# Patient Record
Sex: Male | Born: 2000 | Race: Black or African American | Hispanic: No | Marital: Single | Smoking: Never smoker
Health system: Southern US, Community
[De-identification: ages and names within clinical notes are randomized; demographics above are authoritative.]

---
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
## 2018-05-08 DIAGNOSIS — L700 Acne vulgaris: Secondary | ICD-10-CM | POA: Diagnosis not present

## 2018-05-08 DIAGNOSIS — L819 Disorder of pigmentation, unspecified: Secondary | ICD-10-CM | POA: Diagnosis not present

## 2018-07-17 DIAGNOSIS — S9031XA Contusion of right foot, initial encounter: Secondary | ICD-10-CM | POA: Diagnosis not present

## 2018-07-17 DIAGNOSIS — M79671 Pain in right foot: Secondary | ICD-10-CM | POA: Diagnosis not present

## 2018-08-12 DIAGNOSIS — M7661 Achilles tendinitis, right leg: Secondary | ICD-10-CM | POA: Diagnosis not present

## 2018-08-12 DIAGNOSIS — M79671 Pain in right foot: Secondary | ICD-10-CM | POA: Diagnosis not present

## 2018-08-12 DIAGNOSIS — M7751 Other enthesopathy of right foot: Secondary | ICD-10-CM | POA: Diagnosis not present

## 2018-10-31 DIAGNOSIS — M7751 Other enthesopathy of right foot: Secondary | ICD-10-CM | POA: Diagnosis not present

## 2018-10-31 DIAGNOSIS — M79671 Pain in right foot: Secondary | ICD-10-CM | POA: Diagnosis not present

## 2019-01-21 DIAGNOSIS — R200 Anesthesia of skin: Secondary | ICD-10-CM | POA: Diagnosis not present

## 2019-01-21 DIAGNOSIS — R202 Paresthesia of skin: Secondary | ICD-10-CM | POA: Diagnosis not present

## 2019-01-21 DIAGNOSIS — M25512 Pain in left shoulder: Secondary | ICD-10-CM | POA: Diagnosis not present

## 2019-01-22 DIAGNOSIS — Z130 Encounter for screening for diseases of the blood and blood-forming organs and certain disorders involving the immune mechanism: Secondary | ICD-10-CM | POA: Diagnosis not present

## 2019-01-30 DIAGNOSIS — M25512 Pain in left shoulder: Secondary | ICD-10-CM | POA: Diagnosis not present

## 2019-01-30 DIAGNOSIS — M7542 Impingement syndrome of left shoulder: Secondary | ICD-10-CM | POA: Diagnosis not present

## 2019-02-05 DIAGNOSIS — J019 Acute sinusitis, unspecified: Secondary | ICD-10-CM | POA: Diagnosis not present

## 2019-02-05 DIAGNOSIS — Z209 Contact with and (suspected) exposure to unspecified communicable disease: Secondary | ICD-10-CM | POA: Diagnosis not present

## 2019-02-05 DIAGNOSIS — B9689 Other specified bacterial agents as the cause of diseases classified elsewhere: Secondary | ICD-10-CM | POA: Diagnosis not present

## 2019-02-05 DIAGNOSIS — Z03818 Encounter for observation for suspected exposure to other biological agents ruled out: Secondary | ICD-10-CM | POA: Diagnosis not present

## 2019-02-19 DIAGNOSIS — D649 Anemia, unspecified: Secondary | ICD-10-CM | POA: Diagnosis not present

## 2019-04-02 DIAGNOSIS — Z1159 Encounter for screening for other viral diseases: Secondary | ICD-10-CM | POA: Diagnosis not present

## 2019-04-07 DIAGNOSIS — M50222 Other cervical disc displacement at C5-C6 level: Secondary | ICD-10-CM | POA: Diagnosis not present

## 2019-04-07 DIAGNOSIS — M5144 Schmorl's nodes, thoracic region: Secondary | ICD-10-CM | POA: Diagnosis not present

## 2019-04-21 DIAGNOSIS — Z1159 Encounter for screening for other viral diseases: Secondary | ICD-10-CM | POA: Diagnosis not present

## 2019-05-05 DIAGNOSIS — Z03818 Encounter for observation for suspected exposure to other biological agents ruled out: Secondary | ICD-10-CM | POA: Diagnosis not present

## 2019-05-09 DIAGNOSIS — Z1159 Encounter for screening for other viral diseases: Secondary | ICD-10-CM | POA: Diagnosis not present

## 2019-05-12 DIAGNOSIS — Z1159 Encounter for screening for other viral diseases: Secondary | ICD-10-CM | POA: Diagnosis not present

## 2019-05-20 DIAGNOSIS — Z1159 Encounter for screening for other viral diseases: Secondary | ICD-10-CM | POA: Diagnosis not present

## 2019-06-25 DIAGNOSIS — Z1159 Encounter for screening for other viral diseases: Secondary | ICD-10-CM | POA: Diagnosis not present

## 2019-07-01 DIAGNOSIS — Z1159 Encounter for screening for other viral diseases: Secondary | ICD-10-CM | POA: Diagnosis not present

## 2019-07-07 DIAGNOSIS — Z1159 Encounter for screening for other viral diseases: Secondary | ICD-10-CM | POA: Diagnosis not present

## 2019-07-09 DIAGNOSIS — Z1159 Encounter for screening for other viral diseases: Secondary | ICD-10-CM | POA: Diagnosis not present

## 2019-07-10 DIAGNOSIS — I499 Cardiac arrhythmia, unspecified: Secondary | ICD-10-CM | POA: Diagnosis not present

## 2019-07-10 DIAGNOSIS — M5412 Radiculopathy, cervical region: Secondary | ICD-10-CM | POA: Diagnosis not present

## 2019-07-10 DIAGNOSIS — F064 Anxiety disorder due to known physiological condition: Secondary | ICD-10-CM | POA: Diagnosis not present

## 2019-07-10 DIAGNOSIS — S060X0A Concussion without loss of consciousness, initial encounter: Secondary | ICD-10-CM | POA: Diagnosis not present

## 2019-07-14 DIAGNOSIS — S060X0A Concussion without loss of consciousness, initial encounter: Secondary | ICD-10-CM | POA: Diagnosis not present

## 2019-07-15 DIAGNOSIS — Z1159 Encounter for screening for other viral diseases: Secondary | ICD-10-CM | POA: Diagnosis not present

## 2019-07-15 DIAGNOSIS — S060X0A Concussion without loss of consciousness, initial encounter: Secondary | ICD-10-CM | POA: Diagnosis not present

## 2019-07-17 DIAGNOSIS — S060X0A Concussion without loss of consciousness, initial encounter: Secondary | ICD-10-CM | POA: Diagnosis not present

## 2019-07-22 DIAGNOSIS — S060X0A Concussion without loss of consciousness, initial encounter: Secondary | ICD-10-CM | POA: Diagnosis not present

## 2019-07-22 DIAGNOSIS — Z1159 Encounter for screening for other viral diseases: Secondary | ICD-10-CM | POA: Diagnosis not present

## 2019-07-25 DIAGNOSIS — S060X0A Concussion without loss of consciousness, initial encounter: Secondary | ICD-10-CM | POA: Diagnosis not present

## 2019-07-28 DIAGNOSIS — S060X0A Concussion without loss of consciousness, initial encounter: Secondary | ICD-10-CM | POA: Diagnosis not present

## 2019-07-29 DIAGNOSIS — R9431 Abnormal electrocardiogram [ECG] [EKG]: Secondary | ICD-10-CM | POA: Diagnosis not present

## 2019-07-29 DIAGNOSIS — I499 Cardiac arrhythmia, unspecified: Secondary | ICD-10-CM | POA: Diagnosis not present

## 2019-07-29 DIAGNOSIS — Z1159 Encounter for screening for other viral diseases: Secondary | ICD-10-CM | POA: Diagnosis not present

## 2019-07-30 DIAGNOSIS — S060X0A Concussion without loss of consciousness, initial encounter: Secondary | ICD-10-CM | POA: Diagnosis not present

## 2019-08-04 DIAGNOSIS — Z1159 Encounter for screening for other viral diseases: Secondary | ICD-10-CM | POA: Diagnosis not present

## 2019-08-06 DIAGNOSIS — S060X0A Concussion without loss of consciousness, initial encounter: Secondary | ICD-10-CM | POA: Diagnosis not present

## 2019-08-07 DIAGNOSIS — R9431 Abnormal electrocardiogram [ECG] [EKG]: Secondary | ICD-10-CM | POA: Diagnosis not present

## 2019-08-07 DIAGNOSIS — S060X0A Concussion without loss of consciousness, initial encounter: Secondary | ICD-10-CM | POA: Diagnosis not present

## 2019-08-07 DIAGNOSIS — I071 Rheumatic tricuspid insufficiency: Secondary | ICD-10-CM | POA: Diagnosis not present

## 2019-08-07 DIAGNOSIS — I499 Cardiac arrhythmia, unspecified: Secondary | ICD-10-CM | POA: Diagnosis not present

## 2019-08-12 DIAGNOSIS — S060X0A Concussion without loss of consciousness, initial encounter: Secondary | ICD-10-CM | POA: Diagnosis not present

## 2019-08-14 DIAGNOSIS — S060X0A Concussion without loss of consciousness, initial encounter: Secondary | ICD-10-CM | POA: Diagnosis not present

## 2019-08-18 DIAGNOSIS — S060X0A Concussion without loss of consciousness, initial encounter: Secondary | ICD-10-CM | POA: Diagnosis not present

## 2019-08-19 DIAGNOSIS — I491 Atrial premature depolarization: Secondary | ICD-10-CM | POA: Diagnosis not present

## 2019-08-19 DIAGNOSIS — I471 Supraventricular tachycardia: Secondary | ICD-10-CM | POA: Diagnosis not present

## 2019-08-19 DIAGNOSIS — I493 Ventricular premature depolarization: Secondary | ICD-10-CM | POA: Diagnosis not present

## 2019-08-21 DIAGNOSIS — S060X0A Concussion without loss of consciousness, initial encounter: Secondary | ICD-10-CM | POA: Diagnosis not present

## 2019-09-09 DIAGNOSIS — Z1152 Encounter for screening for COVID-19: Secondary | ICD-10-CM | POA: Diagnosis not present

## 2019-09-12 DIAGNOSIS — R109 Unspecified abdominal pain: Secondary | ICD-10-CM | POA: Diagnosis not present

## 2019-09-12 DIAGNOSIS — K759 Inflammatory liver disease, unspecified: Secondary | ICD-10-CM | POA: Diagnosis not present

## 2019-09-12 DIAGNOSIS — R1013 Epigastric pain: Secondary | ICD-10-CM | POA: Diagnosis not present

## 2019-09-12 DIAGNOSIS — N289 Disorder of kidney and ureter, unspecified: Secondary | ICD-10-CM | POA: Diagnosis not present

## 2019-09-12 DIAGNOSIS — B179 Acute viral hepatitis, unspecified: Secondary | ICD-10-CM | POA: Diagnosis not present

## 2019-09-13 DIAGNOSIS — R109 Unspecified abdominal pain: Secondary | ICD-10-CM | POA: Diagnosis not present

## 2019-09-13 DIAGNOSIS — R1013 Epigastric pain: Secondary | ICD-10-CM | POA: Diagnosis not present

## 2019-09-17 DIAGNOSIS — R748 Abnormal levels of other serum enzymes: Secondary | ICD-10-CM | POA: Diagnosis not present

## 2019-09-17 DIAGNOSIS — E860 Dehydration: Secondary | ICD-10-CM | POA: Diagnosis not present

## 2019-09-17 DIAGNOSIS — Z09 Encounter for follow-up examination after completed treatment for conditions other than malignant neoplasm: Secondary | ICD-10-CM | POA: Diagnosis not present

## 2019-11-07 DIAGNOSIS — S21209A Unspecified open wound of unspecified back wall of thorax without penetration into thoracic cavity, initial encounter: Secondary | ICD-10-CM | POA: Diagnosis not present

## 2019-11-07 DIAGNOSIS — L089 Local infection of the skin and subcutaneous tissue, unspecified: Secondary | ICD-10-CM | POA: Diagnosis not present

## 2020-01-20 DIAGNOSIS — L298 Other pruritus: Secondary | ICD-10-CM | POA: Diagnosis not present

## 2020-01-20 DIAGNOSIS — B356 Tinea cruris: Secondary | ICD-10-CM | POA: Diagnosis not present

## 2020-01-30 ENCOUNTER — Encounter: Payer: Self-pay | Admitting: Emergency Medicine

## 2020-01-30 ENCOUNTER — Observation Stay
Admission: EM | Admit: 2020-01-30 | Discharge: 2020-01-30 | Disposition: A | Discharge status: Home / Self Care | Payer: Medicaid Other | Attending: Internal Medicine | Admitting: Internal Medicine

## 2020-01-30 ENCOUNTER — Other Ambulatory Visit: Payer: Self-pay

## 2020-01-30 ENCOUNTER — Emergency Department: Payer: Medicaid Other

## 2020-01-30 DIAGNOSIS — R1084 Generalized abdominal pain: Secondary | ICD-10-CM | POA: Diagnosis not present

## 2020-01-30 DIAGNOSIS — Z20822 Contact with and (suspected) exposure to covid-19: Secondary | ICD-10-CM | POA: Diagnosis not present

## 2020-01-30 DIAGNOSIS — K56600 Partial intestinal obstruction, unspecified as to cause: Principal | ICD-10-CM | POA: Insufficient documentation

## 2020-01-30 DIAGNOSIS — R1031 Right lower quadrant pain: Secondary | ICD-10-CM | POA: Diagnosis not present

## 2020-01-30 DIAGNOSIS — K56609 Unspecified intestinal obstruction, unspecified as to partial versus complete obstruction: Secondary | ICD-10-CM | POA: Diagnosis not present

## 2020-01-30 DIAGNOSIS — R109 Unspecified abdominal pain: Secondary | ICD-10-CM | POA: Diagnosis present

## 2020-01-30 DIAGNOSIS — K5900 Constipation, unspecified: Secondary | ICD-10-CM | POA: Insufficient documentation

## 2020-01-30 LAB — URINALYSIS, COMPLETE (UACMP) WITH MICROSCOPIC
Bacteria, UA: NONE SEEN
Bilirubin Urine: NEGATIVE
Glucose, UA: NEGATIVE mg/dL
Hgb urine dipstick: NEGATIVE
Ketones, ur: NEGATIVE mg/dL
Leukocytes,Ua: NEGATIVE
Nitrite: NEGATIVE
Protein, ur: NEGATIVE mg/dL
Specific Gravity, Urine: 1.019 (ref 1.005–1.030)
Squamous Epithelial / LPF: NONE SEEN (ref 0–5)
pH: 6 (ref 5.0–8.0)

## 2020-01-30 LAB — CBC WITH DIFFERENTIAL/PLATELET
Abs Immature Granulocytes: 0.01 10*3/uL (ref 0.00–0.07)
Basophils Absolute: 0 10*3/uL (ref 0.0–0.1)
Basophils Relative: 1 %
Eosinophils Absolute: 0.1 10*3/uL (ref 0.0–0.5)
Eosinophils Relative: 2 %
HCT: 43.9 % (ref 39.0–52.0)
Hemoglobin: 14.8 g/dL (ref 13.0–17.0)
Immature Granulocytes: 0 %
Lymphocytes Relative: 44 %
Lymphs Abs: 2 10*3/uL (ref 0.7–4.0)
MCH: 30.7 pg (ref 26.0–34.0)
MCHC: 33.7 g/dL (ref 30.0–36.0)
MCV: 91.1 fL (ref 80.0–100.0)
Monocytes Absolute: 0.5 10*3/uL (ref 0.1–1.0)
Monocytes Relative: 10 %
Neutro Abs: 2 10*3/uL (ref 1.7–7.7)
Neutrophils Relative %: 43 %
Platelets: 202 10*3/uL (ref 150–400)
RBC: 4.82 MIL/uL (ref 4.22–5.81)
RDW: 12 % (ref 11.5–15.5)
WBC: 4.6 10*3/uL (ref 4.0–10.5)
nRBC: 0 % (ref 0.0–0.2)

## 2020-01-30 LAB — COMPREHENSIVE METABOLIC PANEL
ALT: 25 U/L (ref 0–44)
AST: 33 U/L (ref 15–41)
Albumin: 4.4 g/dL (ref 3.5–5.0)
Alkaline Phosphatase: 64 U/L (ref 38–126)
Anion gap: 11 (ref 5–15)
BUN: 15 mg/dL (ref 6–20)
CO2: 29 mmol/L (ref 22–32)
Calcium: 9.6 mg/dL (ref 8.9–10.3)
Chloride: 100 mmol/L (ref 98–111)
Creatinine, Ser: 1.17 mg/dL (ref 0.61–1.24)
GFR calc Af Amer: >60 mL/min (ref >60)
GFR calc non Af Amer: >60 mL/min (ref >60)
Glucose, Bld: 95 mg/dL (ref 70–99)
Potassium: 4.1 mmol/L (ref 3.5–5.1)
Sodium: 140 mmol/L (ref 135–145)
Total Bilirubin: 1 mg/dL (ref 0.3–1.2)
Total Protein: 7.4 g/dL (ref 6.5–8.1)

## 2020-01-30 LAB — LIPASE, BLOOD: Lipase: 25 U/L (ref 11–51)

## 2020-01-30 LAB — SARS CORONAVIRUS 2 BY RT PCR (HOSPITAL ORDER, PERFORMED IN ~~LOC~~ HOSPITAL LAB): SARS Coronavirus 2: NEGATIVE

## 2020-01-30 MED ORDER — SODIUM CHLORIDE 0.9 % IV BOLUS
1000.0000 mL | Freq: Once | INTRAVENOUS | Status: AC
  Administered 2020-01-30: 1000 mL via INTRAVENOUS

## 2020-01-30 MED ORDER — FLEET ENEMA 7-19 GM/118ML RE ENEM
1.0000 | ENEMA | Freq: Once | RECTAL | Status: AC
  Administered 2020-01-30: 1 via RECTAL

## 2020-01-30 MED ORDER — IOHEXOL 300 MG/ML  SOLN
100.0000 mL | Freq: Once | INTRAMUSCULAR | Status: AC | PRN
  Administered 2020-01-30: 100 mL via INTRAVENOUS
  Filled 2020-01-30: qty 100

## 2020-01-30 MED ORDER — SENNA 8.6 MG PO TABS: 1.0000 | ORAL_TABLET | Freq: Two times a day (BID) | ORAL | 0 refills | Status: AC

## 2020-01-30 MED ORDER — BISACODYL 10 MG RE SUPP
10.0000 mg | Freq: Once | RECTAL | Status: AC
  Administered 2020-01-30: 10 mg via RECTAL
  Filled 2020-01-30: qty 1

## 2020-01-30 MED ORDER — ONDANSETRON HCL 4 MG/2ML IJ SOLN
4.0000 mg | Freq: Once | INTRAMUSCULAR | Status: AC
  Administered 2020-01-30: 4 mg via INTRAVENOUS
  Filled 2020-01-30: qty 2

## 2020-01-30 NOTE — Consult Note (Signed)
Subjective:   CC: Small bowel obstruction  HPI:  Ricardo Peterson is a 19 y.o. male who was consulted by Levada Schilling for issue above.  Symptoms were first noted Several hours ago. Pain is sharp, confined to the periumbilical area, without radiation.  Associated with nausea vomiting, exacerbated by nothing specific  Has history of constipation with similar symptoms before.  Last bowel movement was a couple days ago.  He also endorses eating new food for the first time yesterday for dinner.  Currently he states the pain has resolved after an enema.  Had some hiccups and bloating sensation which resolved after episodes of emesis before the CT scan.   Past Medical History:  has no past medical history on file.  Past Surgical History: Exploratory laparotomy as a child  Family History: family history is not on file.  Social History:  reports that he has never smoked. He has never used smokeless tobacco. He reports that he does not drink alcohol and does not use drugs.  Current Medications:  Prior to Admission medications   Medication Sig Start Date End Date Taking? Authorizing Provider  senna (SENOKOT) 8.6 MG TABS tablet Take 1 tablet (8.6 mg total) by mouth 2 (two) times daily. 01/30/20   Enedina Finner, MD    Allergies:  Allergies as of 01/30/2020  . (No Known Allergies)    ROS:  General: Denies weight loss, weight gain, fatigue, fevers, chills, and night sweats. Eyes: Denies blurry vision, double vision, eye pain, itchy eyes, and tearing. Ears: Denies hearing loss, earache, and ringing in ears. Nose: Denies sinus pain, congestion, infections, runny nose, and nosebleeds. Mouth/throat: Denies hoarseness, sore throat, bleeding gums, and difficulty swallowing. Heart: Denies chest pain, palpitations, racing heart, irregular heartbeat, leg pain or swelling, and decreased activity tolerance. Respiratory: Denies breathing difficulty, shortness of breath, wheezing, cough, and sputum. GI: Denies  change in appetite, heartburn, nausea, vomiting, constipation, diarrhea, and blood in stool. GU: Denies difficulty urinating, pain with urinating, urgency, frequency, blood in urine. Musculoskeletal: Denies joint stiffness, pain, swelling, muscle weakness. Skin: Denies rash, itching, mass, tumors, sores, and boils Neurologic: Denies headache, fainting, dizziness, seizures, numbness, and tingling. Psychiatric: Denies depression, anxiety, difficulty sleeping, and memory loss. Endocrine: Denies heat or cold intolerance, and increased thirst or urination. Blood/lymph: Denies easy bruising, easy bruising, and swollen glands     Objective:     BP 127/75 (BP Location: Right Arm)   Pulse (!) 52   Temp 97.7 F (36.5 C)   Resp 16   Ht 6\' 2"  (1.88 m)   Wt 86.2 kg   SpO2 100%   BMI 24.39 kg/m   Constitutional :  alert, cooperative, appears stated age and no distress  Lymphatics/Throat:  no asymmetry, masses, or scars  Respiratory:  clear to auscultation bilaterally  Cardiovascular:  regular rate and rhythm  Gastrointestinal: soft, non-tender; bowel sounds normal; no masses,  no organomegaly.   Musculoskeletal: Steady movement  Skin: Cool and moist, horizontal surgical scar   Psychiatric: Normal affect, non-agitated, not confused       LABS:  CMP Latest Ref Rng & Units 01/30/2020 06/23/2017  Glucose 70 - 99 mg/dL 95 06/25/2017)  BUN 6 - 20 mg/dL 15 17  Creatinine 630(Z - 1.24 mg/dL 6.01 0.93  Sodium 2.35 - 145 mmol/L 140 138  Potassium 3.5 - 5.1 mmol/L 4.1 3.6  Chloride 98 - 111 mmol/L 100 101  CO2 22 - 32 mmol/L 29 24  Calcium 8.9 - 10.3 mg/dL 9.6 9.3  Total Protein 6.5 - 8.1 g/dL 7.4 7.8  Total Bilirubin 0.3 - 1.2 mg/dL 1.0 5.7(D)  Alkaline Phos 38 - 126 U/L 64 95  AST 15 - 41 U/L 33 140(H)  ALT 0 - 44 U/L 25 54   CBC Latest Ref Rng & Units 01/30/2020 06/23/2017  WBC 4.0 - 10.5 K/uL 4.6 5.4  Hemoglobin 13.0 - 17.0 g/dL 22.0 25.4  Hematocrit 39 - 52 % 43.9 40.9  Platelets 150 - 400  K/uL 202 162    RADS: CLINICAL DATA:  Right lower quadrant abdominal pain.  EXAM: CT ABDOMEN AND PELVIS WITH CONTRAST  TECHNIQUE: Multidetector CT imaging of the abdomen and pelvis was performed using the standard protocol following bolus administration of intravenous contrast.  CONTRAST:  OMNIPAQUE IOHEXOL 300 MG/ML  SOLN  COMPARISON:  June 23, 2017.  FINDINGS: Lower chest: No acute abnormality.  Hepatobiliary: No focal liver abnormality is seen. No gallstones, gallbladder wall thickening, or biliary dilatation.  Pancreas: Unremarkable. No pancreatic ductal dilatation or surrounding inflammatory changes.  Spleen: Normal in size without focal abnormality.  Adrenals/Urinary Tract: Adrenal glands are unremarkable. Kidneys are normal, without renal calculi, focal lesion, or hydronephrosis. Bladder is unremarkable.  Stomach/Bowel: The stomach appears normal. No colonic dilatation is noted. Mildly dilated small bowel loops are noted in the lower abdomen with some degree fecalization, concerning for or ileus or possibly distal small bowel obstruction. The appendix appears to be normal.  Vascular/Lymphatic: No significant vascular findings are present. No enlarged abdominal or pelvic lymph nodes.  Reproductive: Prostate is unremarkable.  Other: No abdominal wall hernia or abnormality. Small amount of free fluid is noted in the pelvis.  Musculoskeletal: No acute or significant osseous findings.  IMPRESSION: Mildly dilated small bowel loops are noted in the lower abdomen and pelvis with some degree of fecalization, concerning for partial distal small bowel obstruction or possibly ileus. Small amount of free fluid is noted in the pelvis as well.   Electronically Signed   By: Lupita Raider M.D.   On: 01/30/2020 09:15  Assessment:   Small bowel obstruction, partial at best.  She had a bowel movement since admission as well as resolution  of symptoms after eating some graham crackers.  Plan:    Despite clinical exam, CT scan did show some dilated loops of bowel with equalization within.  I explained to patient that he currently may be feeling better due to temporarily decompression of the stomach as needed emesis, bowel movement from residual stool in the colon.  Explained to him that we typically watch patients for at least 24 hours to ensure that symptoms do not recur, patient is motivated to be discharged from the hospital so he can attend classes starting tomorrow.  I explained to him that outpatient observation will also be an alternative as long as he is able to return for any recurrent symptoms.  I left my contact information for him and asked him to call the number directly to potentially bypass the emergency department if needed.  He verbalized understanding.  ED return precautions of recurrent symptoms again discussed and he is now agreeable for discharge.  Hospitalist updated with plan in is agreeable as well.
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
## 2020-01-30 NOTE — ED Notes (Signed)
Commode placed at bedside. Patient tolerated Fleets enema well.

## 2020-01-30 NOTE — ED Notes (Signed)
See triage note  Presents with abd pain  States pain is mid abd ,from epigastric area to lower stomach  No n/v or fever  Pain started in the mid lower abd

## 2020-01-30 NOTE — ED Notes (Signed)
Up to bathroom  Vomiting  Provider aware

## 2020-01-30 NOTE — ED Triage Notes (Addendum)
Patient ambulatory to triage with steady gait, without difficulty or distress noted; pt reports awoke with generalized abd pain; st last BM 2 days ago unrelieved by OTC laxative; denies N/V

## 2020-01-30 NOTE — Progress Notes (Signed)
Patient ID: Erica Osuna [facility], male   DOB: 2001-05-11, 18 y.o.   MRN: [id number]  Patient was seen by surgery Dr. Tonna Boehringer. Okay from surgical standpoint to discharge. Will prescribe bowel prep over-the-counter to be taken at home. Patient did have medium stool and some diarrhea liquid he still after getting Dulcolax suppository and anime. He ate some graham crackers and drink 2 cups of water was able to keep it down. Overall he feels better. Discussed with patient and his girlfriend to return to ER if sign symptoms worsen. Dietary instructions given.  Discharge to home. RN informed.
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
## 2020-01-30 NOTE — ED Notes (Signed)
Taking po fluids well  No n/v  States he feels better but still having some discomfort

## 2020-03-05 DIAGNOSIS — S76812A Strain of other specified muscles, fascia and tendons at thigh level, left thigh, initial encounter: Secondary | ICD-10-CM | POA: Diagnosis not present

## 2020-03-18 DIAGNOSIS — M25552 Pain in left hip: Secondary | ICD-10-CM | POA: Diagnosis not present

## 2020-04-07 DIAGNOSIS — Z1159 Encounter for screening for other viral diseases: Secondary | ICD-10-CM | POA: Diagnosis not present

## 2020-04-13 DIAGNOSIS — M25552 Pain in left hip: Secondary | ICD-10-CM | POA: Diagnosis not present

## 2020-09-05 DIAGNOSIS — R300 Dysuria: Secondary | ICD-10-CM | POA: Diagnosis not present

## 2020-09-05 DIAGNOSIS — Z202 Contact with and (suspected) exposure to infections with a predominantly sexual mode of transmission: Secondary | ICD-10-CM | POA: Diagnosis not present

## 2020-09-05 DIAGNOSIS — Z6824 Body mass index (BMI) 24.0-24.9, adult: Secondary | ICD-10-CM | POA: Diagnosis not present

## 2020-09-11 DIAGNOSIS — R300 Dysuria: Secondary | ICD-10-CM | POA: Diagnosis not present

## 2020-09-11 DIAGNOSIS — Z6824 Body mass index (BMI) 24.0-24.9, adult: Secondary | ICD-10-CM | POA: Diagnosis not present

## 2022-01-07 DIAGNOSIS — M25572 Pain in left ankle and joints of left foot: Secondary | ICD-10-CM | POA: Diagnosis not present

## 2022-01-07 DIAGNOSIS — S93402A Sprain of unspecified ligament of left ankle, initial encounter: Secondary | ICD-10-CM | POA: Diagnosis not present

## 2022-03-19 DIAGNOSIS — Z6824 Body mass index (BMI) 24.0-24.9, adult: Secondary | ICD-10-CM | POA: Diagnosis not present

## 2022-03-19 DIAGNOSIS — S60011A Contusion of right thumb without damage to nail, initial encounter: Secondary | ICD-10-CM | POA: Diagnosis not present

## 2022-04-27 IMAGING — CT CT ABD-PELV W/ CM
2 of 4 series · 16 of 46 positions shown, 18 images · IV contrast (APPLIED)
Comparison: June 23, 2017.

CLINICAL DATA: Right lower quadrant abdominal pain.

EXAM:
CT ABDOMEN AND PELVIS WITH CONTRAST
TECHNIQUE: Multidetector CT imaging of the abdomen and pelvis was performed
using the standard protocol following bolus administration of
intravenous contrast.
CONTRAST:  100mL OMNIPAQUE IOHEXOL 300 MG/ML  SOLN

[Series 2: routine abd/pel with · axial · 0.66mm/px · z∈[-892,-472]mm · 13 of 92 slices shown, 15 images]
[im 4/92  soft-tissue]
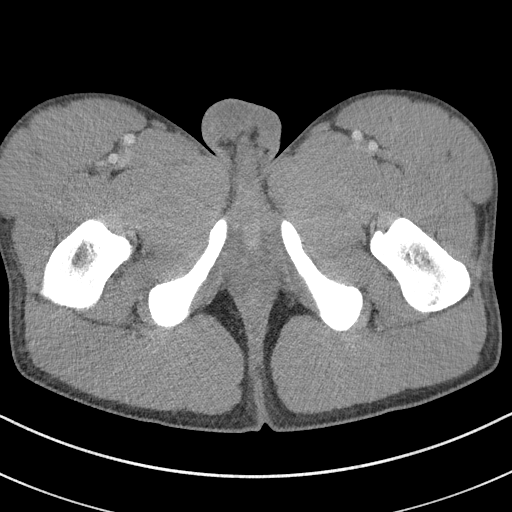
[im 4/92  bone]
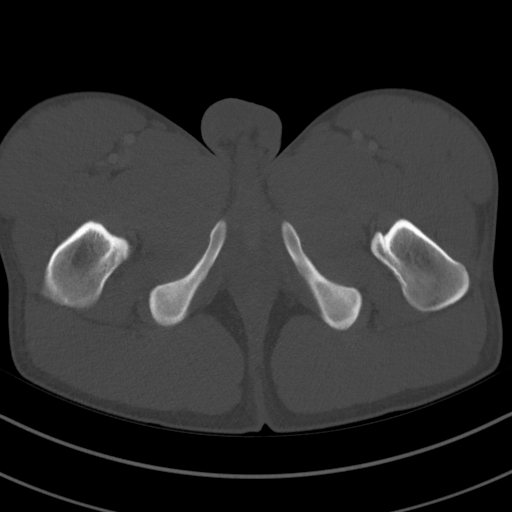
[im 11/92  soft-tissue]
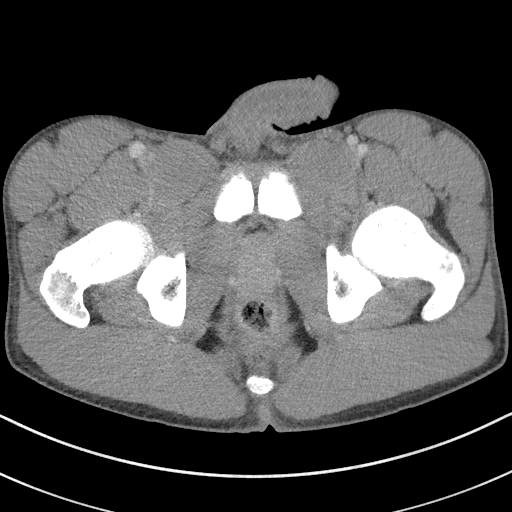
[im 19/92  soft-tissue]
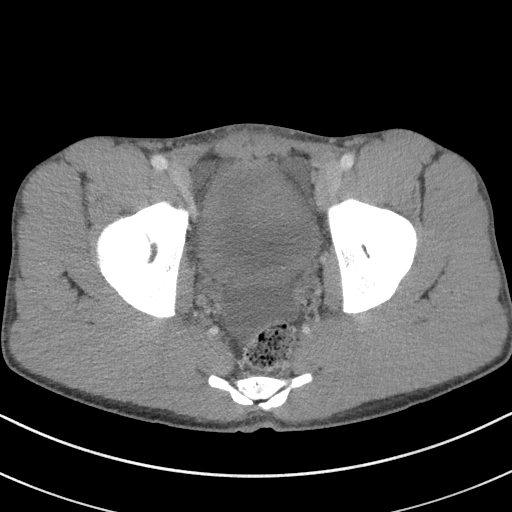
[im 26/92  soft-tissue]
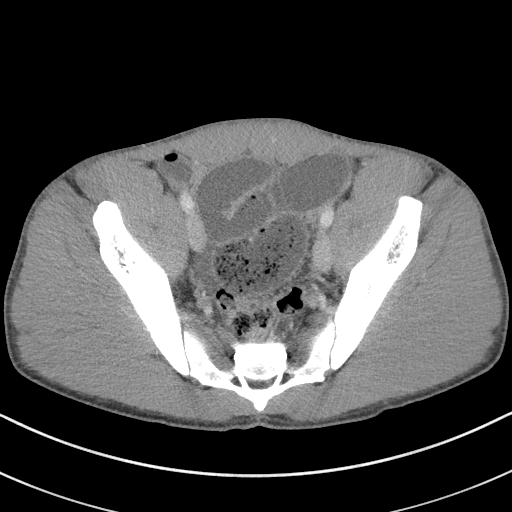
[im 33/92  soft-tissue]
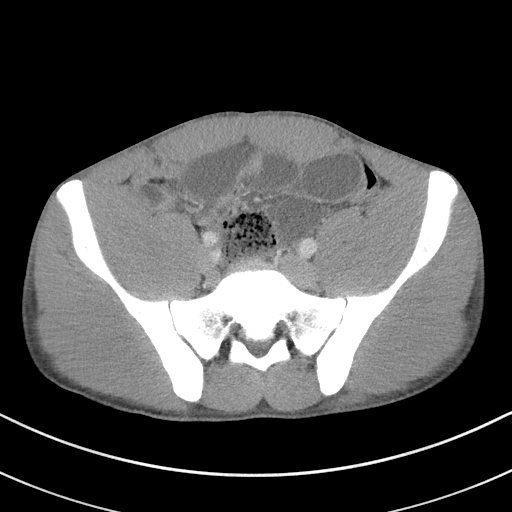
[im 41/92  soft-tissue]
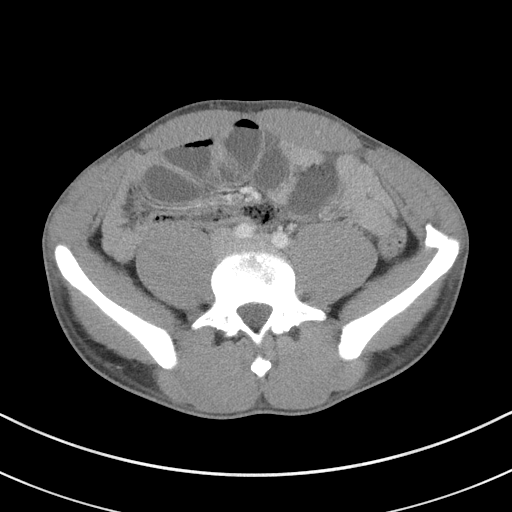
[im 48/92  soft-tissue]
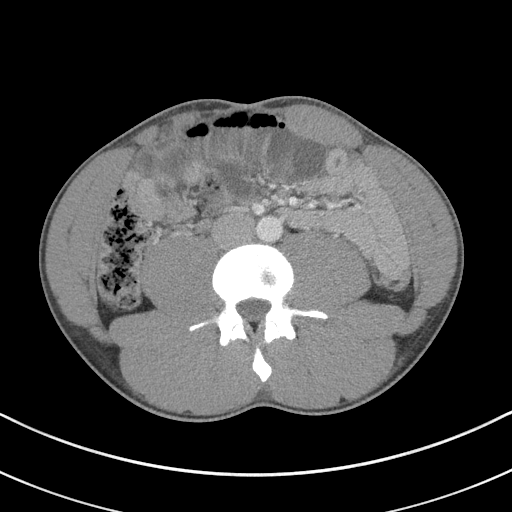
[im 51/92  soft-tissue]
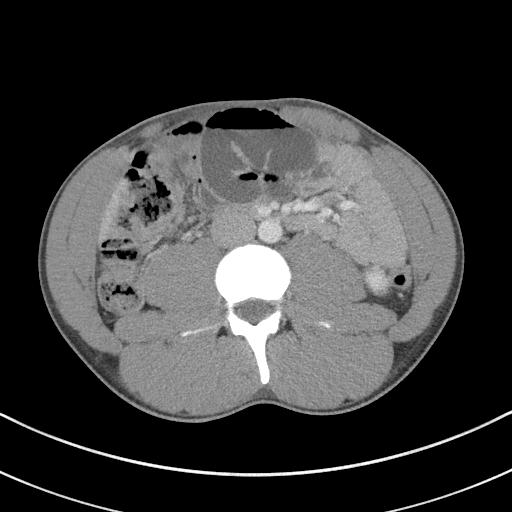
[im 59/92  soft-tissue]
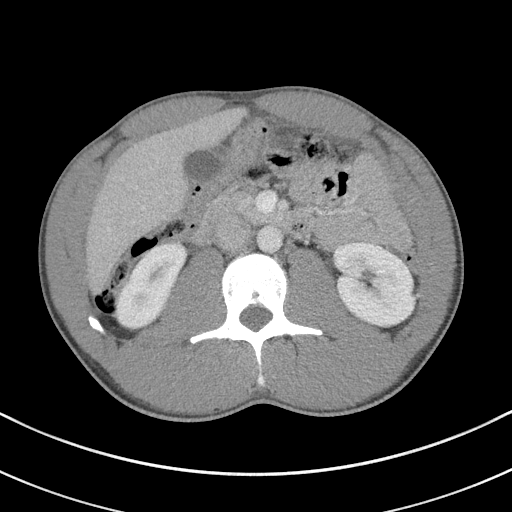
[im 59/92  bone]
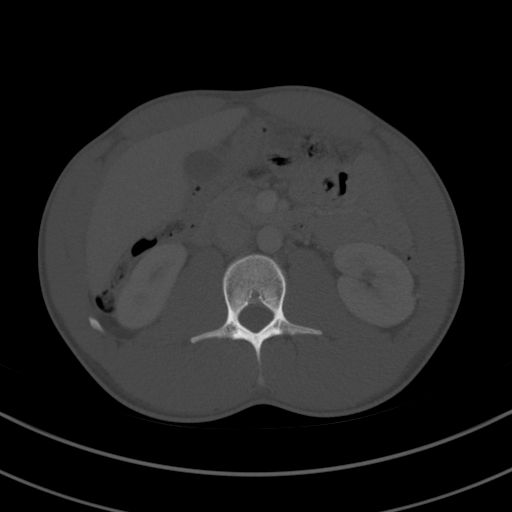
[im 66/92  soft-tissue]
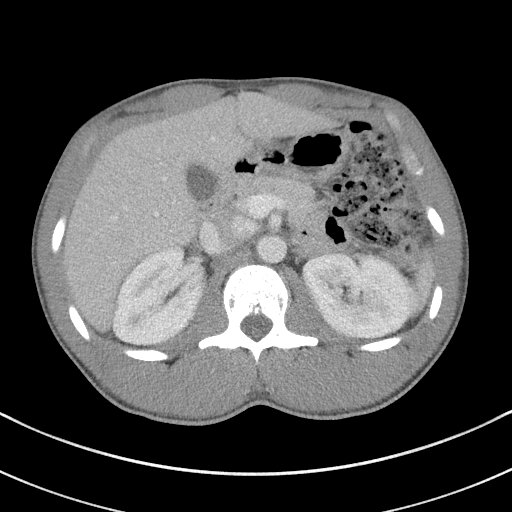
[im 73/92  soft-tissue]
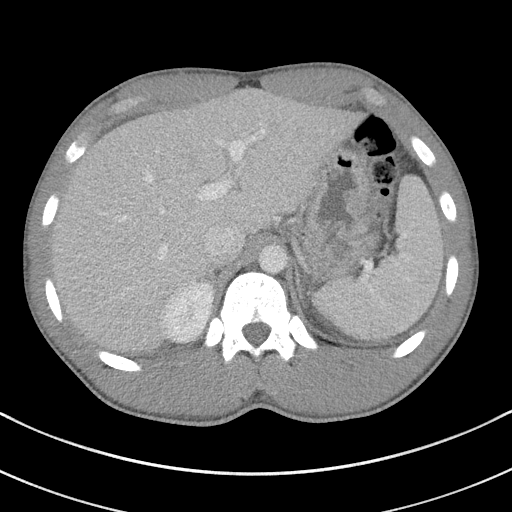
[im 81/92  soft-tissue]
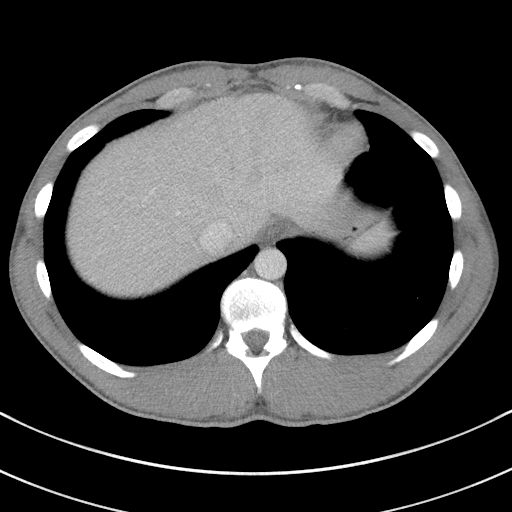
[im 88/92  soft-tissue]
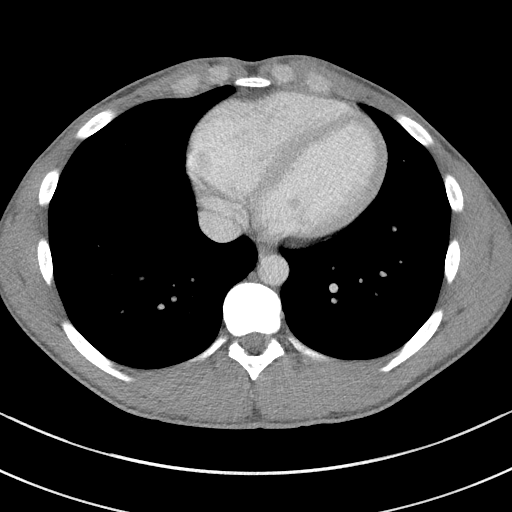

[Series 5: coronal st · coronal · 0.70mm/px · 3 of 83 slices shown]
[im 28/83  soft-tissue]
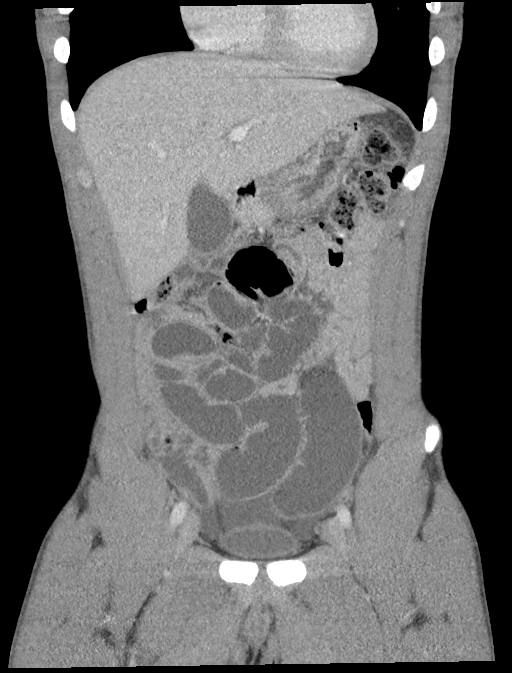
[im 37/83  soft-tissue]
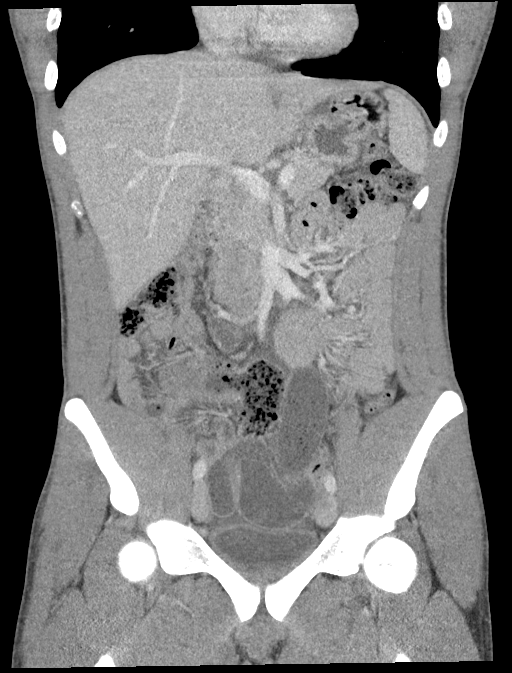
[im 46/83  soft-tissue]
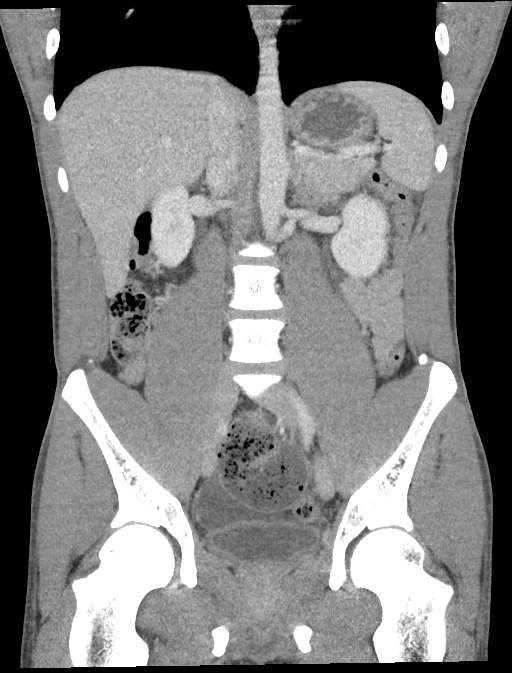

[16 of 46 positions shown; findings below may reference images not displayed]

FINDINGS: Lower chest: No acute abnormality.

Hepatobiliary: No focal liver abnormality is seen. No gallstones,
gallbladder wall thickening, or biliary dilatation.

Pancreas: Unremarkable. No pancreatic ductal dilatation or
surrounding inflammatory changes.

Spleen: Normal in size without focal abnormality.

Adrenals/Urinary Tract: Adrenal glands are unremarkable. Kidneys are
normal, without renal calculi, focal lesion, or hydronephrosis.
Bladder is unremarkable.

Stomach/Bowel: The stomach appears normal. No colonic dilatation is
noted. Mildly dilated small bowel loops are noted in the lower
abdomen with some degree fecalization, concerning for or ileus or
possibly distal small bowel obstruction. The appendix appears to be
normal.

Vascular/Lymphatic: No significant vascular findings are present. No
enlarged abdominal or pelvic lymph nodes.

Reproductive: Prostate is unremarkable.

Other: No abdominal wall hernia or abnormality. Small amount of free
fluid is noted in the pelvis.

Musculoskeletal: No acute or significant osseous findings.
IMPRESSION: Mildly dilated small bowel loops are noted in the lower abdomen and
pelvis with some degree of fecalization, concerning for partial
distal small bowel obstruction or possibly ileus. Small amount of
free fluid is noted in the pelvis as well.

## 2022-05-25 ENCOUNTER — Other Ambulatory Visit: Payer: Self-pay | Admitting: Family Medicine

## 2022-05-25 ENCOUNTER — Ambulatory Visit
Admission: RE | Admit: 2022-05-25 | Discharge: 2022-05-25 | Disposition: A | Discharge status: Home / Self Care | Payer: Medicaid Other | Attending: Family Medicine | Admitting: Family Medicine

## 2022-05-25 ENCOUNTER — Ambulatory Visit
Admission: RE | Admit: 2022-05-25 | Discharge: 2022-05-25 | Disposition: A | Discharge status: Home / Self Care | Payer: Medicaid Other | Source: Ambulatory Visit | Attending: Family Medicine | Admitting: Family Medicine

## 2022-05-25 DIAGNOSIS — S93502A Unspecified sprain of left great toe, initial encounter: Secondary | ICD-10-CM | POA: Diagnosis not present

## 2022-05-25 DIAGNOSIS — M79675 Pain in left toe(s): Secondary | ICD-10-CM | POA: Diagnosis not present

## 2023-01-18 DIAGNOSIS — Z6825 Body mass index (BMI) 25.0-25.9, adult: Secondary | ICD-10-CM | POA: Diagnosis not present

## 2023-01-18 DIAGNOSIS — Z1159 Encounter for screening for other viral diseases: Secondary | ICD-10-CM | POA: Diagnosis not present

## 2023-01-18 DIAGNOSIS — J069 Acute upper respiratory infection, unspecified: Secondary | ICD-10-CM | POA: Diagnosis not present

## 2023-01-18 DIAGNOSIS — Z03818 Encounter for observation for suspected exposure to other biological agents ruled out: Secondary | ICD-10-CM | POA: Diagnosis not present

## 2023-03-23 DIAGNOSIS — R079 Chest pain, unspecified: Secondary | ICD-10-CM | POA: Diagnosis not present

## 2023-03-23 DIAGNOSIS — Z6827 Body mass index (BMI) 27.0-27.9, adult: Secondary | ICD-10-CM | POA: Diagnosis not present

## 2023-03-23 DIAGNOSIS — R209 Unspecified disturbances of skin sensation: Secondary | ICD-10-CM | POA: Diagnosis not present

## 2023-05-11 DIAGNOSIS — Z23 Encounter for immunization: Secondary | ICD-10-CM | POA: Diagnosis not present

## 2023-05-11 DIAGNOSIS — Z0001 Encounter for general adult medical examination with abnormal findings: Secondary | ICD-10-CM | POA: Diagnosis not present

## 2023-05-11 DIAGNOSIS — K30 Functional dyspepsia: Secondary | ICD-10-CM | POA: Diagnosis not present

## 2023-05-11 DIAGNOSIS — Z6828 Body mass index (BMI) 28.0-28.9, adult: Secondary | ICD-10-CM | POA: Diagnosis not present

## 2023-05-15 DIAGNOSIS — R0789 Other chest pain: Secondary | ICD-10-CM | POA: Diagnosis not present
